# Patient Record
Sex: Male | Born: 1955 | Race: White | Hispanic: No | Marital: Married | Smoking: Current every day smoker
Health system: Southern US, Community
[De-identification: ages and names within clinical notes are randomized; demographics above are authoritative.]

---

## 2002-07-14 HISTORY — PX: OTHER SURGICAL HISTORY: SHX169

## 2008-02-23 ENCOUNTER — Inpatient Hospital Stay (HOSPITAL_COMMUNITY): Admission: EM | Admit: 2008-02-23 | Discharge: 2008-02-25 | Payer: Self-pay | Admitting: Emergency Medicine

## 2008-02-24 ENCOUNTER — Encounter (INDEPENDENT_AMBULATORY_CARE_PROVIDER_SITE_OTHER): Payer: Self-pay | Admitting: General Surgery

## 2010-11-26 NOTE — H&P (Signed)
NAME:  Craig Marshall, Craig Marshall NO.:  [id number]   MEDICAL RECORD NO.:  [id number]          PATIENT TYPE:  INP   LOCATION:  A309                          FACILITY:  [facility]   PHYSICIAN:  Barbaraann Barthel, M.D. DATE OF BIRTH:  02/22/1956   DATE OF ADMISSION:  02/23/2008  DATE OF DISCHARGE:  LH                              HISTORY & PHYSICAL   This is a 55 year old white male who came to the emergency room last  night was admitted for perianal abscess.   HISTORY OF PRESENT MEDICAL ILLNESS:  He has had 3 days history of  perianal discomfort and he noted some discharge.  He came to the  emergency room from [facility] and was admitted.  He had a CT scan done  last night, which did not reveal any perirectal component to this.  He  was admitted, and parenteral antibiotics were initiated and with plans  for an I and D and rigid proctoscopy to be done on him in the a.m.  It  should be noted the patient gives a history of 3 years of bright red  rectal bleeding.  He has had no colonoscopy.  He has no family history  of colon cancer that he related.   OTHER GI SYMPTOMS:  He has had no nausea, vomiting, unexplained weight  loss, or past history of hepatitis, or history of alcohol abuse.   PHYSICAL EXAMINATION:  As follows:  VITAL SIGNS:  He is 6 feet 2 inches, weight is 220 pounds.  His  temperature is 98.1, blood pressure 115/77, and heart rate is 60,  respirations 20, and O2 sat 90%.  HEAD:  Normocephalic.  EYES:  Extraocular movements intact.  Pupils were round and reactive to  light and accommodation.  There is no conjunctival pallor or scleral  injection.  The sclera has a normal tincture.  Nose and oral mucosa are  moist.  NECK:  Supple and cylindrical without jugular vein distention,  thyromegaly, tracheal deviation, or cervical adenopathy.  No bruits are  auscultated.  CHEST:  Clear in both the anterior and posterior auscultation.  HEART:  Regular rhythm.  ABDOMEN:  Soft.  No  femoral or inguinal hernias.  No umbilical hernias.  RECTAL:  There is tenderness in the anterior perianal area in the  midline with small amount of erythema.  Genitals are otherwise normal,  both descended testicles, and no other abnormalities.  EXTREMITIES:  Within normal limits.   REVIEW OF SYSTEMS:  GI SYSTEM:  As mentioned above.  GU SYSTEM:  No history of nephrolithiasis or dysuria.  ENDOCRINE SYSTEM:  No history of diabetes or thyroid disease.  NEURO SYSTEM:  Within normal limits.  MUSCULOSKELETAL SYSTEM:  He has some aches and pains related to the  heaviness of his work, but otherwise no other positive findings.   PAST SURGERY:  He has had none.  He does states that he has been  bothered with hemorrhoids from time-to-time.   LABORATORY DATA:  His electrolytes are within normal limits.  His white  count is 14.8 with an H and H of 15.3 and 45.0 with  71% neutrophils.  Platelet count is normal at 233,000.   IMPRESSION:  Therefore, Craig Marshall is a 55 year old white male with 3  day history of perianal discomfort and clinical findings suggesting a  perineal abscess.  We will perform an I and D and rigid proctoscopy.   As an outpatient, we will arrange colonoscopy for him because he needs  this with a history of 3 years of occasional bright red rectal bleeding.      Barbaraann Barthel, M.D.  Electronically Signed     WB/MEDQ  D:  02/24/2008  T:  02/24/2008  Job:  [id number]

## 2010-11-26 NOTE — Op Note (Signed)
NAME:  Craig Marshall, Craig Marshall NO.:  [id number]   MEDICAL RECORD NO.:  [id number]          PATIENT TYPE:  INP   LOCATION:  A309                          FACILITY:  [facility]   PHYSICIAN:  Barbaraann Barthel, M.D. DATE OF BIRTH:  [birth year]   DATE OF PROCEDURE:  02/24/2008  DATE OF DISCHARGE:                               OPERATIVE REPORT   SURGEON:  Barbaraann Barthel, M.D.   PREOPERATIVE DIAGNOSIS:  Perianal abscess.   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   PROCEDURES:  Rigid proctoscopy to 25 cm, excision of fistula in-ano, and  curettage of chronic abscess cavity.   NOTE:  This is a 55 year old white male who had chronic perianal  complaints for a couple of years.  He had some occasional bleeding and  discomfort in this area.  Recently, he had a flare-up.  He was admitted  through the emergency room.  He was thought to have a perianal abscess.  CT scan was done to rule out a perirectal component.  He was treated  with antibiotics.  We took him to surgery after discussing the need for  drainage procedure.  No perirectal component was seen on the CT scan.  We discussed complications not limited to, but including bleeding,  infection, and recurrence.  Informed consent was obtained.  Postoperative finding was consistent with an anterior fistula and  opening in the anterior midline with its opening directly across from  its external opening, normal rigid proctoscopy to 25 cm, and a chronic  abscess cavity in the midline going towards the mid scrotal raphe.   SPECIMENS:  Perianal tissue and cultures were obtained.  Wound  classification is contaminated.   TECHNIQUE:  The patient was placed in the Jack-knife prone position  after the adequate administration of spinal anesthesia.  Digital exam  was performed and then a rigid proctoscopy to 25 cm.  There were small  internal hemorrhoids, but none that were involved with this  process and  none that I thought required surgery at this point.  The patient had an  obvious fistula in-ano with an interior opening in the midline directly  across from its exterior opening, which was in the midline anteriorly.  He also had an abscess cavity in the midline tracking towards his mid  scrotal raphe.   After digital examination and proctoscopy, the probe was placed in the  external opening and then the fistula in-ano was then unroofed and  curetted as was the abscess cavity.  This was irrigated copiously with  saline solution.  Cultures were obtained.  There was not any great  amount of contamination there, as the patient had been draining from his  external opening.  After irrigating this, we then tacked it open with  1/2-inch iodoform gauze soaked in saline.  Perianal tissue was also sent  as a specimen along with the cultures.  After checking for hemostasis,  we then used approximately 10 mL of 0.5% Sensorcaine to help with  postoperative comfort and a perianal block.  A sterile dressing was  applied.  Prior  to closure, all sponge, needle, and instrument counts  were found to be correct.  Estimated blood loss was minimal.  The  patient received 800 mL of crystalloids intraoperatively.   I explained postoperative care to the wife, who works as a sonography  and is a Careers information officer; she knows the importance of irrigation,  cleaning this, and perianal care.  We also discussed the need with her  for a colonoscopy as  this patient will need to have that because of his symptoms of bright  red rectal bleeding, although the explanation is certainly at hand with  what we treated surgically.  He is 55 years old and colonoscopy should  be obtained when the patient is comfortable enough to do so.      Barbaraann Barthel, M.D.  Electronically Signed     WB/MEDQ  D:  02/24/2008  T:  02/25/2008  Job:  [id number]

## 2010-11-29 NOTE — Discharge Summary (Signed)
NAME:  KAYSTON, JODOIN NO.:  [id number]   MEDICAL RECORD NO.:  [id number]          PATIENT TYPE:  INP   LOCATION:  A309                          FACILITY:  [facility]   PHYSICIAN:  Barbaraann Barthel, M.D. DATE OF BIRTH:  1955/08/09   DATE OF ADMISSION:  02/23/2008  DATE OF DISCHARGE:  08/14/2009LH                               DISCHARGE SUMMARY   PROCEDURES:  1. On February 24, 2008, excision of fistula in ano and drainage of      perianal abscess.  2. Rigid proctoscopy to 25 cm.   Note, this is a 55 year old white male who was admitted through the  emergency room with perianal abscess.  He was taken to surgery after CT  scan revealed that he had no high perirectal component with his abscess.  This was found to be under examination more closely in the operating  room to be a fistula in ano with the external opening being anteriorly.  This was opened and unroofed, and curetted and removed the abscess that  was near the perianal portion anteriorly in the midline between the  scrotum and the anus, and then central raphe was opened and packed and  irrigated.  The patient also underwent a proctoscopy to 25 cm  intraoperatively as he had some history of bright red rectal bleeding.  No other abnormalities were encountered.  The patient did well.  He was  discharged on the first postoperative day.  Afterwards, his wife who is  part of our medical personnel and is well acquainted with wound care,  will follow up with the explained wound care and she will also make  arrangements for his colonoscopy as this has been discussed as the  patient was having episodes of bright red rectal bleeding.  Postoperatively from this, he did quite well.   His laboratory data is as follows:  The pathology report showed perianal  tissue, anal skin with granulation, and suppurative inflammation with  abscess identified.  No atypia was encountered.   LABORATORY DATA:  Otherwise, his white count was  14,000 when he was  admitted on February 23, 2008, with an H&H of 15 and 45.  His electrolytes  were grossly within normal limits.  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   DISCHARGE INSTRUCTIONS:  The patient's wound care was explained in  detail.  He is discharged on his regular medicines, in which he takes  vitamin E 400 units daily, potassium 99, and he takes potassium daily.  He also was discharged with Tylox 1 tablet every 4-6 hours for pain.  We  will follow up with him.  He is also told to take a stool  softener to avoid straining of his stools.  We will follow up with him  as well perioperatively, after which he is to return to his regular  medical doctor.  Apparently, he sees someone in [place].  At any rate,  the wife is aware of the need for colonoscopy, and she will  make those  arrangements.      Barbaraann Barthel, M.D.  Electronically Signed     WB/MEDQ  D:  03/08/2008  T:  03/09/2008  Job:  [id number]

## 2011-04-11 LAB — CULTURE, ROUTINE-ABSCESS

## 2011-04-11 LAB — CBC
HCT: 45
Hemoglobin: 15.3
WBC: 14.8 — ABNORMAL HIGH

## 2011-04-11 LAB — DIFFERENTIAL
Eosinophils Relative: 0
Lymphocytes Relative: 19
Lymphs Abs: 2.8
Monocytes Absolute: 1.4 — ABNORMAL HIGH

## 2011-04-11 LAB — BASIC METABOLIC PANEL
GFR calc Af Amer: >60
GFR calc non Af Amer: >60
Potassium: 4
Sodium: 138

## 2015-07-29 ENCOUNTER — Emergency Department (HOSPITAL_COMMUNITY): Payer: BLUE CROSS/BLUE SHIELD

## 2015-07-29 ENCOUNTER — Emergency Department (HOSPITAL_COMMUNITY)
Admission: EM | Admit: 2015-07-29 | Discharge: 2015-07-30 | Disposition: A | Discharge status: Home / Self Care | Payer: BLUE CROSS/BLUE SHIELD | Attending: Emergency Medicine | Admitting: Emergency Medicine

## 2015-07-29 ENCOUNTER — Encounter (HOSPITAL_COMMUNITY): Payer: Self-pay | Admitting: Emergency Medicine

## 2015-07-29 DIAGNOSIS — K648 Other hemorrhoids: Secondary | ICD-10-CM | POA: Diagnosis not present

## 2015-07-29 DIAGNOSIS — K829 Disease of gallbladder, unspecified: Secondary | ICD-10-CM | POA: Insufficient documentation

## 2015-07-29 DIAGNOSIS — F17210 Nicotine dependence, cigarettes, uncomplicated: Secondary | ICD-10-CM | POA: Diagnosis not present

## 2015-07-29 DIAGNOSIS — K921 Melena: Secondary | ICD-10-CM | POA: Diagnosis present

## 2015-07-29 LAB — COMPREHENSIVE METABOLIC PANEL
ALBUMIN: 5 g/dL (ref 3.5–5.0)
ALT: 31 U/L (ref 17–63)
AST: 26 U/L (ref 15–41)
Alkaline Phosphatase: 56 U/L (ref 38–126)
Anion gap: 9 (ref 5–15)
BILIRUBIN TOTAL: 0.5 mg/dL (ref 0.3–1.2)
BUN: 21 mg/dL — ABNORMAL HIGH (ref 6–20)
CHLORIDE: 107 mmol/L (ref 101–111)
CO2: 25 mmol/L (ref 22–32)
CREATININE: 1.23 mg/dL (ref 0.61–1.24)
Calcium: 9.6 mg/dL (ref 8.9–10.3)
GFR calc Af Amer: >60 mL/min (ref >60)
GFR calc non Af Amer: >60 mL/min (ref >60)
GLUCOSE: 104 mg/dL — AB (ref 65–99)
Potassium: 3.7 mmol/L (ref 3.5–5.1)
SODIUM: 141 mmol/L (ref 135–145)
Total Protein: 7.9 g/dL (ref 6.5–8.1)

## 2015-07-29 LAB — URINALYSIS, ROUTINE W REFLEX MICROSCOPIC
Bilirubin Urine: NEGATIVE
GLUCOSE, UA: NEGATIVE mg/dL
HGB URINE DIPSTICK: NEGATIVE
Leukocytes, UA: NEGATIVE
Nitrite: NEGATIVE
Protein, ur: NEGATIVE mg/dL
pH: 5.5 (ref 5.0–8.0)

## 2015-07-29 LAB — CBC
HCT: 48.9 % (ref 39.0–52.0)
Hemoglobin: 16.2 g/dL (ref 13.0–17.0)
MCH: 29.7 pg (ref 26.0–34.0)
MCHC: 33.1 g/dL (ref 30.0–36.0)
MCV: 89.6 fL (ref 78.0–100.0)
PLATELETS: 235 10*3/uL (ref 150–400)
RBC: 5.46 MIL/uL (ref 4.22–5.81)
RDW: 13.4 % (ref 11.5–15.5)
WBC: 9.4 10*3/uL (ref 4.0–10.5)

## 2015-07-29 LAB — LIPASE, BLOOD: Lipase: 37 U/L (ref 11–51)

## 2015-07-29 MED ORDER — IOHEXOL 300 MG/ML  SOLN
100.0000 mL | Freq: Once | INTRAMUSCULAR | Status: AC | PRN
  Administered 2015-07-29: 100 mL via INTRAVENOUS

## 2015-07-29 NOTE — ED Provider Notes (Signed)
CSN: [id number]     Arrival date & time 07/29/15  1919 History  By signing my name below, I, Craig Marshall, attest that this documentation has been prepared under the direction and in the presence of Craig Marshall Craig Partin, PA-C. Electronically Signed: Elon Marshall Marshall ED Scribe. 07/29/2015. 7:33 PM.   Chief Complaint  Patient presents with  . Abdominal Pain  . Blood In Stools   The history is provided by the patient. No language interpreter was used.   HPI Comments: Craig Marshall is a 60 y.o. male who presents to the Emergency Department complaining of dull/aching RUQ abdominal pain onset 4:30 am two days ago.  Following the onset of pain, the patient had a normal bowel movement which was followed by bright red bloody diarrhea.  He has two bowel movements per day since, which is his normal frequency, however, all have contained the bloody diarrhea.  There is no improvement in his abdominal pain with each bowel movement.  He denies recent abdominal surgeries, diet changes, medication changes.  He denies fever.   History reviewed. No pertinent past medical history. Past Surgical History  Procedure Laterality Date  . Rectal abscess  2004   History reviewed. No pertinent family history. Social History  Substance Use Topics  . Smoking status: Current Every Day Smoker    Types: Cigars  . Smokeless tobacco: None  . Alcohol Use: Yes     Comment: occassionally    Review of Systems 10 Systems reviewed and all are negative for acute change except as noted in the HPI.   Allergies  Review of patient's allergies indicates not on file.  Home Medications   Prior to Admission medications   Not on File   BP 140/75 mmHg  Pulse 73  Temp(Src) 98.2 F (36.8 C) (Oral)  Resp 16  Ht 6\' 2"  (1.88 m)  Wt 235 lb (106.595 kg)  BMI 30.16 kg/m2  SpO2 99% Physical Exam  Constitutional: He is oriented to person, place, and time. He appears well-developed and well-nourished. No distress.  HENT:  Head:  Normocephalic and atraumatic.  Eyes: Conjunctivae and EOM are normal.  Neck: Neck supple. No tracheal deviation present.  No cervical lymphadenopathy.  Cardiovascular: Normal rate.   Pulmonary/Chest: Effort normal. No respiratory distress.  Symmetrical rise and fall of the chest.  Lungs CTA.  Abdominal:  Bowel sounds present and active.  Discomfort with palpation of the RUQ.  Musculoskeletal: Normal range of motion. He exhibits no edema.  DP 2+ bilaterally.  No temperature changes of lower extremities.  Cap refill < 2 seconds.   Neurological: He is alert and oriented to person, place, and time.  Skin: Skin is warm and dry.  Psychiatric: He has a normal mood and affect. His behavior is normal.  Nursing note and vitals reviewed.   ED Course  Procedures (including critical care time)  DIAGNOSTIC STUDIES: Oxygen Saturation is 99% on RA, normal by my interpretation.    COORDINATION OF CARE:  7:40 PM Awaiting lab results.  Will discuss with patient when returned.  Patient acknowledges and agrees with plan.    Labs Review Labs Reviewed  LIPASE, BLOOD  COMPREHENSIVE METABOLIC PANEL  CBC  URINALYSIS, ROUTINE W REFLEX MICROSCOPIC (NOT AT [facility])    Imaging Review No results found. I have personally reviewed and evaluated these images and lab results as part of my medical decision-making.   EKG Interpretation None      MDM  Vital signs are within normal limits. There is no  orthostatic changes. The examination is consistent with prolapsed hemorrhoid with bleeding. The hemoglobin is 16, and the hematocrit of 40.9, no evidence of acute blood loss issues.  The patient also had pain in the right upper to mid abdomen. CT scan suggest gallbladder disease. The patient is referred to surgery for evaluation of the prolapsed hemorrhoid and bleeding, as well as the gallbladder disease. Patient is referred to Dr. Lovell Sheehan. Prescription for Protonix and Zantac given to the patient.    Final  diagnoses:  Prolapsed hemorrhoids  Gallbladder disease    *I have reviewed nursing notes, vital signs, and all appropriate lab and imaging results for this patient.*  **I personally performed the services described in this documentation, which was scribed in my presence. The recorded information has been reviewed and is accurate.Craig Quale, PA-C 07/31/15 1816  [name], DO 08/01/15 [phone]

## 2015-07-29 NOTE — ED Notes (Addendum)
Pt states that he started having abd pain with bright red bloody diarrhea Friday morning that has persisted since

## 2015-07-30 MED ORDER — PANTOPRAZOLE SODIUM 40 MG PO TBEC: 40.0000 mg | DELAYED_RELEASE_TABLET | Freq: Every day | ORAL | Status: AC

## 2015-07-30 MED ORDER — RANITIDINE HCL 150 MG PO TABS: 150.0000 mg | ORAL_TABLET | Freq: Two times a day (BID) | ORAL | Status: AC

## 2015-07-30 NOTE — Discharge Instructions (Signed)
Your CT scan suggests some abnormalities involving your gallbladder. It is important that you're seen by one of the surgeons for additional evaluation and management. Your examination reveals prolapsed hemorrhoids. Please increase fiber, liquids, and use a stool softener if necessary. Please see one of the surgeons concerning your prolapsed hemorrhoid as soon as possible. Use Protonix and Zantac daily for assistance with the discomfort in your right abdomen. High-Fiber Diet Fiber, also called dietary fiber, is a type of carbohydrate found in fruits, vegetables, whole grains, and beans. A high-fiber diet can have many health benefits. Your health care provider may recommend a high-fiber diet to help:  Prevent constipation. Fiber can make your bowel movements more regular.  Lower your cholesterol.  Relieve hemorrhoids, uncomplicated diverticulosis, or irritable bowel syndrome.  Prevent overeating as part of a weight-loss plan.  Prevent heart disease, type 2 diabetes, and certain cancers. WHAT IS MY PLAN? The recommended daily intake of fiber includes:  38 grams for men under age 59.  30 grams for men over age 59.  25 grams for women under age 59.  21 grams for women over age 59. You can get the recommended daily intake of dietary fiber by eating a variety of fruits, vegetables, grains, and beans. Your health care provider may also recommend a fiber supplement if it is not possible to get enough fiber through your diet. WHAT DO I NEED TO KNOW ABOUT A HIGH-FIBER DIET?  Fiber supplements have not been widely studied for their effectiveness, so it is better to get fiber through food sources.  Always check the fiber content on thenutrition facts label of any prepackaged food. Look for foods that contain at least 5 grams of fiber per serving.  Ask your dietitian if you have questions about specific foods that are related to your condition, especially if those foods are not listed in the  following section.  Increase your daily fiber consumption gradually. Increasing your intake of dietary fiber too quickly may cause bloating, cramping, or gas.  Drink plenty of water. Water helps you to digest fiber. WHAT FOODS CAN I EAT? Grains Whole-grain breads. Multigrain cereal. Oats and oatmeal. Brown rice. Barley. Bulgur wheat. Millet. Bran muffins. Popcorn. Rye wafer crackers. Vegetables Sweet potatoes. Spinach. Kale. Artichokes. Cabbage. Broccoli. Green peas. Carrots. Squash. Fruits Berries. Pears. Apples. Oranges. Avocados. Prunes and raisins. Dried figs. Meats and Other Protein Sources Navy, kidney, pinto, and soy beans. Split peas. Lentils. Nuts and seeds. Dairy Fiber-fortified yogurt. Beverages Fiber-fortified soy milk. Fiber-fortified orange juice. Other Fiber bars. The items listed above may not be a complete list of recommended foods or beverages. Contact your dietitian for more options. WHAT FOODS ARE NOT RECOMMENDED? Grains White bread. Pasta made with refined flour. White rice. Vegetables Fried potatoes. Canned vegetables. Well-cooked vegetables.  Fruits Fruit juice. Cooked, strained fruit. Meats and Other Protein Sources Fatty cuts of meat. Fried Environmental education officerpoultry or fried fish. Dairy Milk. Yogurt. Cream cheese. Sour cream. Beverages Soft drinks. Other Cakes and pastries. Butter and oils. The items listed above may not be a complete list of foods and beverages to avoid. Contact your dietitian for more information. WHAT ARE SOME TIPS FOR INCLUDING HIGH-FIBER FOODS IN MY DIET?  Eat a wide variety of high-fiber foods.  Make sure that half of all grains consumed each day are whole grains.  Replace breads and cereals made from refined flour or white flour with whole-grain breads and cereals.  Replace white rice with brown rice, bulgur wheat, or millet.  Start the  day with a breakfast that is high in fiber, such as a cereal that contains at least 5 grams of fiber per  serving.  Use beans in place of meat in soups, salads, or pasta.  Eat high-fiber snacks, such as berries, raw vegetables, nuts, or popcorn.   This information is not intended to replace advice given to you by your health care provider. Make sure you discuss any questions you have with your health care provider.   Document Released: 06/30/2005 Document Revised: 07/21/2014 Document Reviewed: 12/13/2013 Elsevier Interactive Patient Education 2016 [employer].  Hemorrhoids Hemorrhoids are puffy (swollen) veins around the rectum or anus. Hemorrhoids can cause pain, itching, bleeding, or irritation. HOME CARE  Eat foods with fiber, such as whole grains, beans, nuts, fruits, and vegetables. Ask your doctor about taking products with added fiber in them (fibersupplements).  Drink enough fluid to keep your pee (urine) clear or pale yellow.  Exercise often.  Go to the bathroom when you have the urge to poop. Do not wait.  Avoid straining to poop (bowel movement).  Keep the butt area dry and clean. Use wet toilet paper or moist paper towels.  Medicated creams and medicine inserted into the anus (anal suppository) may be used or applied as told.  Only take medicine as told by your doctor.  Take a warm water bath (sitz bath) for 15-20 minutes to ease pain. Do this 3-4 times a day.  Place ice packs on the area if it is tender or puffy. Use the ice packs between the warm water baths.  Put ice in a plastic bag.  Place a towel between your skin and the bag.  Leave the ice on for 15-20 minutes, 03-04 times a day.  Do not use a donut-shaped pillow or sit on the toilet for a long time. GET HELP RIGHT AWAY IF:   You have more pain that is not controlled by treatment or medicine.  You have bleeding that will not stop.  You have trouble or are unable to poop (bowel movement).  You have pain or puffiness outside the area of the hemorrhoids. MAKE SURE YOU:   Understand these  instructions.  Will watch your condition.  Will get help right away if you are not doing well or get worse.   This information is not intended to replace advice given to you by your health care provider. Make sure you discuss any questions you have with your health care provider.   Document Released: 04/08/2008 Document Revised: 06/16/2012 Document Reviewed: 05/11/2012 Elsevier Interactive Patient Education [employer]

## 2017-01-24 IMAGING — CT CT ABD-PELV W/ CM
2 of 5 series · 16 of 46 positions shown, 18 images · IV contrast (Omnipaque 300)
Comparison: CT dated 02/23/2008

CLINICAL DATA: 16-year-old male with right upper quadrant abdominal
pain

EXAM:
CT ABDOMEN AND PELVIS WITH CONTRAST
TECHNIQUE: Multidetector CT imaging of the abdomen and pelvis was performed
using the standard protocol following bolus administration of
intravenous contrast.
CONTRAST:  100mL OMNIPAQUE IOHEXOL 300 MG/ML  SOLN

[Series 2: abd_pel_with 5.0 b40f · axial · 0.77mm/px · z∈[-576,-126]mm · 13 of 104 slices shown, 15 images]
[im 7/104  soft-tissue]
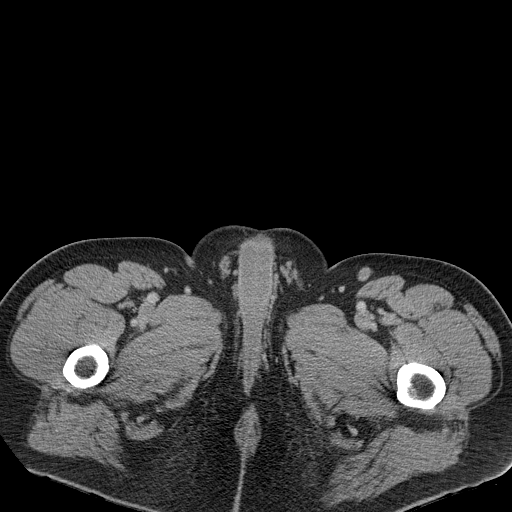
[im 7/104  bone]
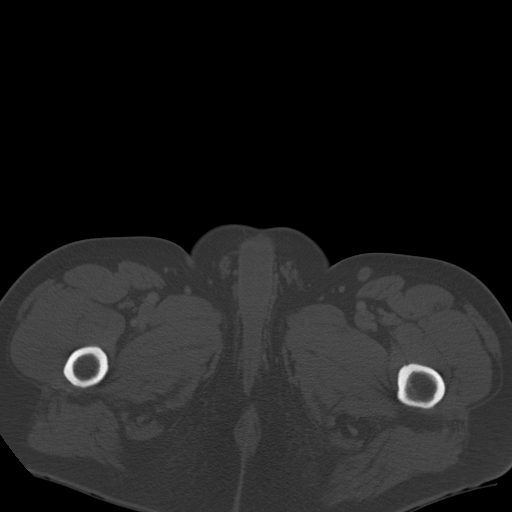
[im 13/104  soft-tissue]
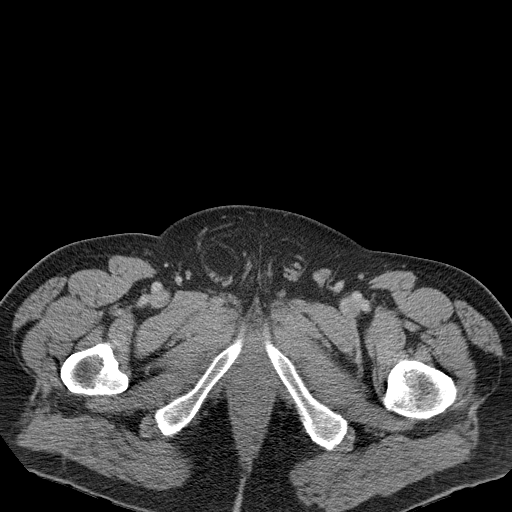
[im 25/104  soft-tissue]
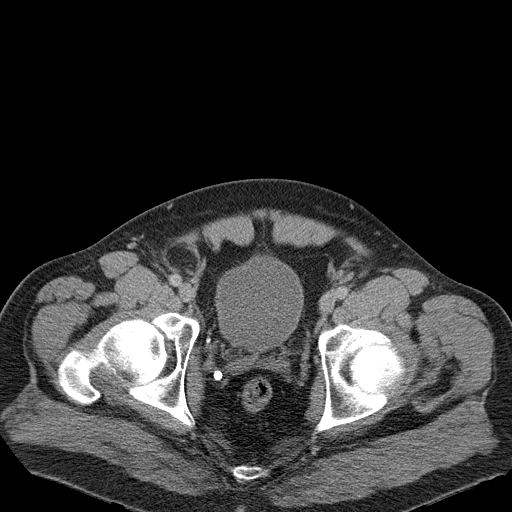
[im 31/104  soft-tissue]
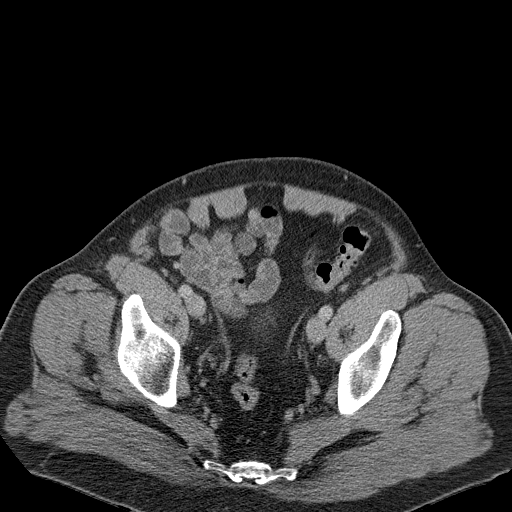
[im 37/104  soft-tissue]
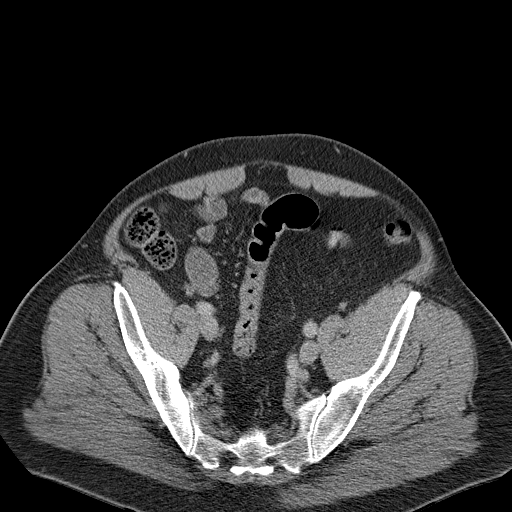
[im 43/104  soft-tissue]
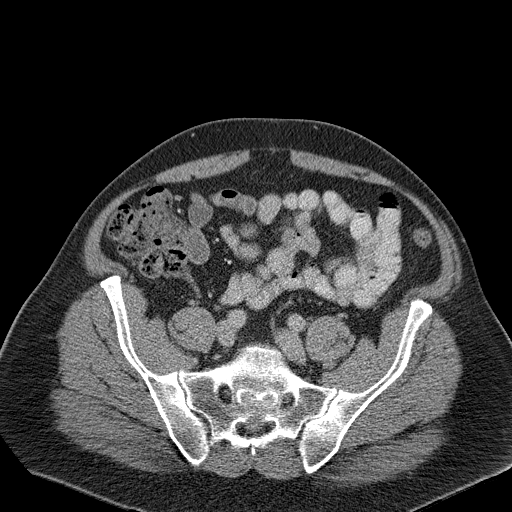
[im 55/104  soft-tissue]
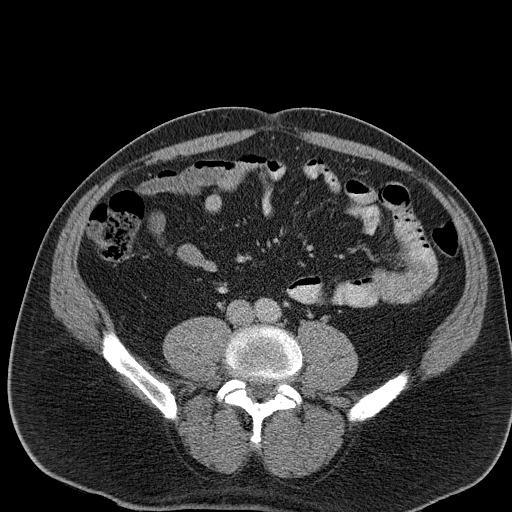
[im 61/104  soft-tissue]
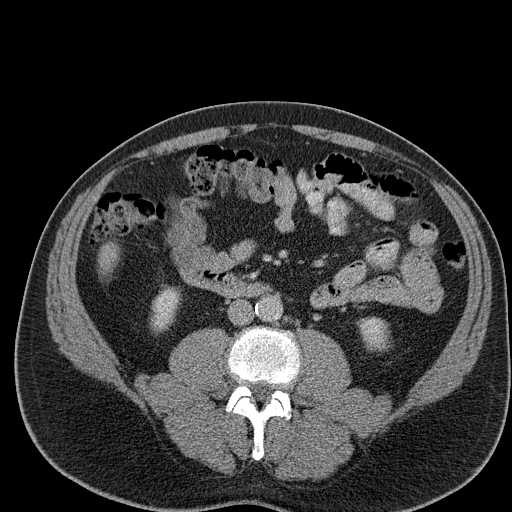
[im 67/104  soft-tissue]
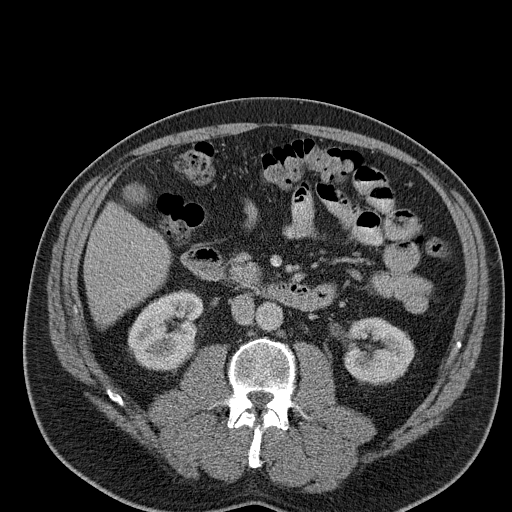
[im 67/104  bone]
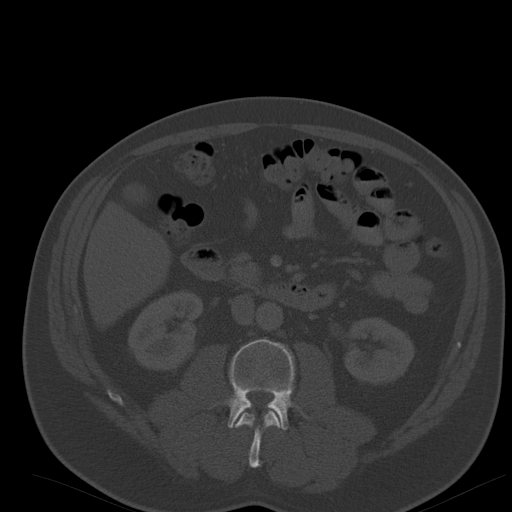
[im 73/104  soft-tissue]
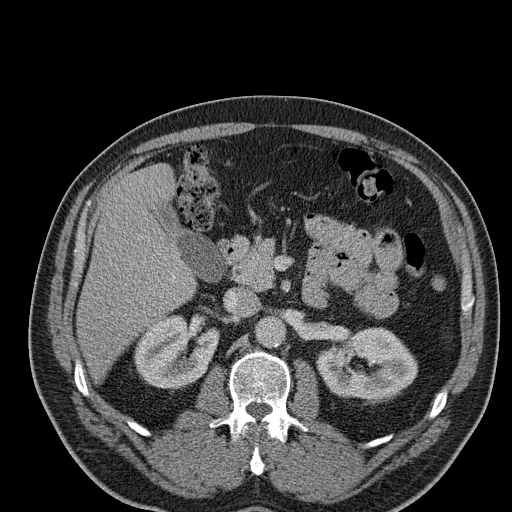
[im 79/104  soft-tissue]
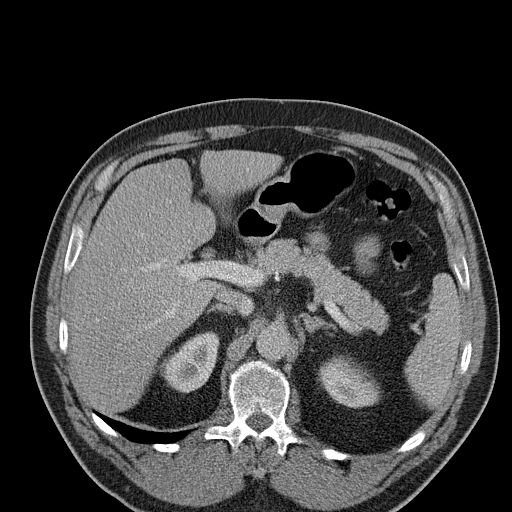
[im 91/104  soft-tissue]
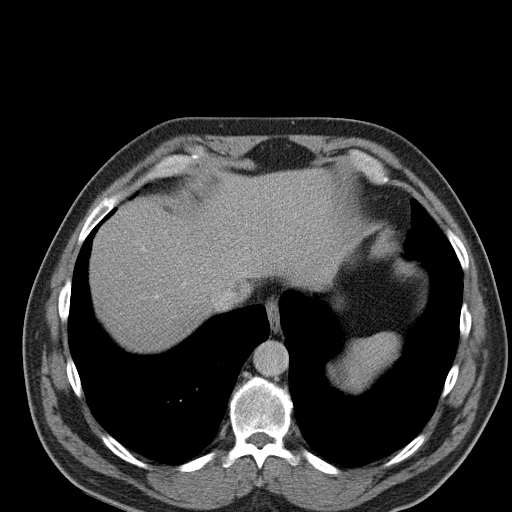
[im 97/104  soft-tissue]
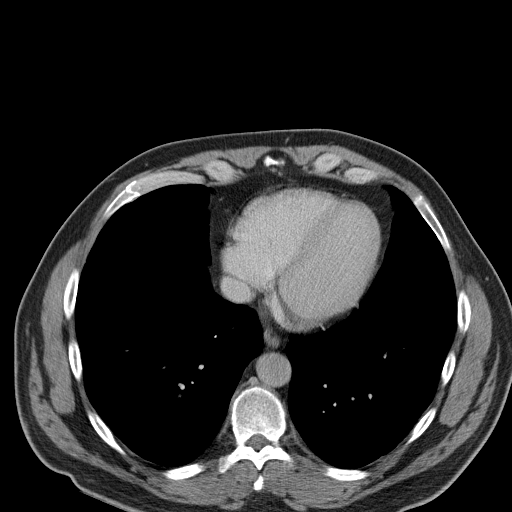

[Series 3: abd_pel_with 3.0 spo cor · coronal · 0.90mm/px · 3 of 106 slices shown]
[im 36/106  soft-tissue]
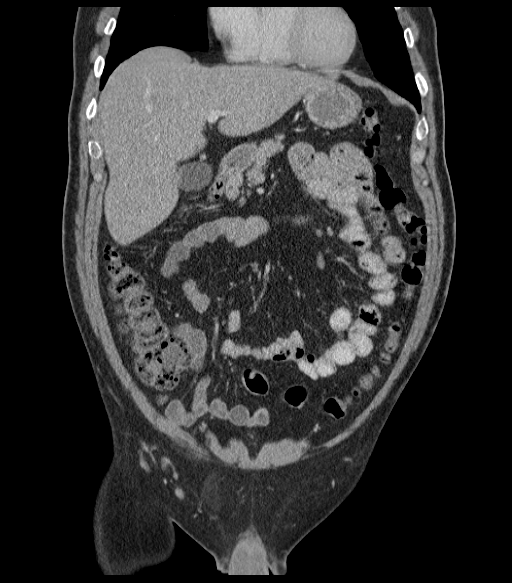
[im 47/106  soft-tissue]
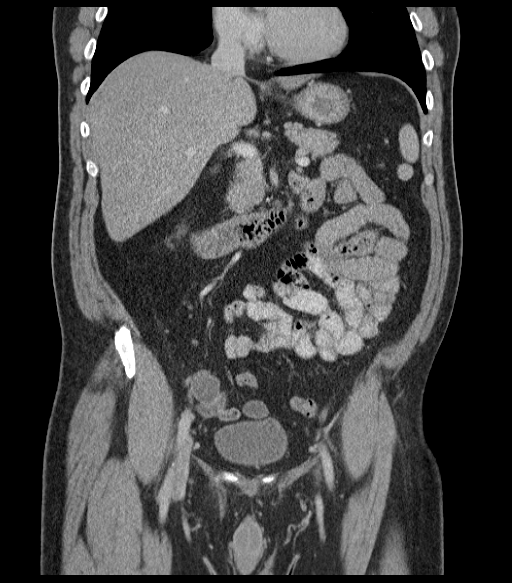
[im 59/106  soft-tissue]
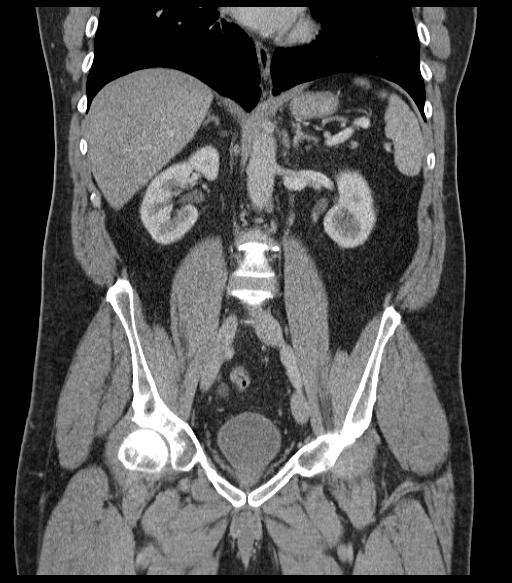

[16 of 46 positions shown; findings below may reference images not displayed]

FINDINGS: The visualized lung bases are clear. No intra-abdominal free air or
free fluid.

There is an apparent focal area of narrowing in the mid body of the
gallbladder which may be related to a focal stricture. There is mild
thickening of the distal body and fundus of the gallbladder which
may represent a segmental adenomyomatosis or related to chronic
cholecystitis. There is slight haziness of the fat surrounding
gallbladder fundus. These findings may be related to chronic
gallbladder disease or represent acute on chronic cholecystitis.
Ultrasound is recommended for better evaluation of the gallbladder.

The liver, pancreas, spleen, adrenal glands, kidneys, visualized
ureters, and urinary bladder appear unremarkable. The prostate and
seminal vesicles are grossly unremarkable.

There is no evidence of bowel obstruction or inflammation. Moderate
stool noted throughout the colon. Normal appendix.

Mild aortoiliac atherosclerotic disease. The abdominal aorta and IVC
appear patent. No portal venous gas identified. There is no
adenopathy. Small fat containing umbilical hernia as well as small
fat containing right inguinal hernia. The abdominal wall soft
tissues are otherwise unremarkable. The osseous structures are
intact.
IMPRESSION: Chronic appearing focal narrowing of the midportion of the
gallbladder with thickening of the distal body and fundus of the
gallbladder. There is slight increase in haziness in the
pericholecystic fat at the gallbladder fundus which may represent an
acute on chronic cholecystitis. Ultrasound is recommended for better
evaluation of the gallbladder.

No other acute intra-abdominal or pelvic pathology identified.
